# Patient Record
Sex: Male | Born: 1995 | Race: Black or African American | Hispanic: No | Marital: Single | Smoking: Never smoker
Health system: Southern US, Community
[De-identification: ages and names within clinical notes are randomized; demographics above are authoritative.]

---
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
## 2018-04-22 IMAGING — DX DG FACIAL BONES COMPLETE 3+V
4 series · 4 of 4 positions shown · non-contrast
Comparison: None available

CLINICAL DATA: Left eye injury playing basketball, left eye pain
and tenderness

EXAM:
FACIAL BONES COMPLETE 3+V

[facial pa townes]
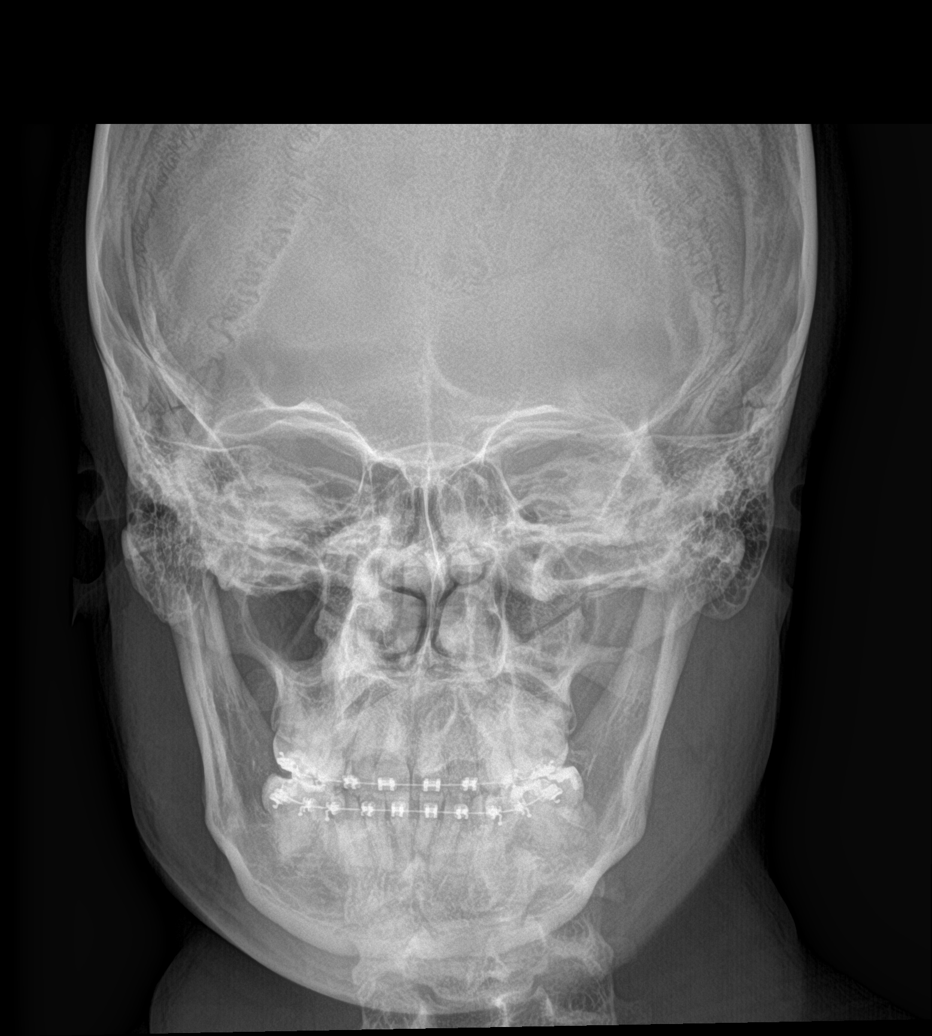

[facial waters]
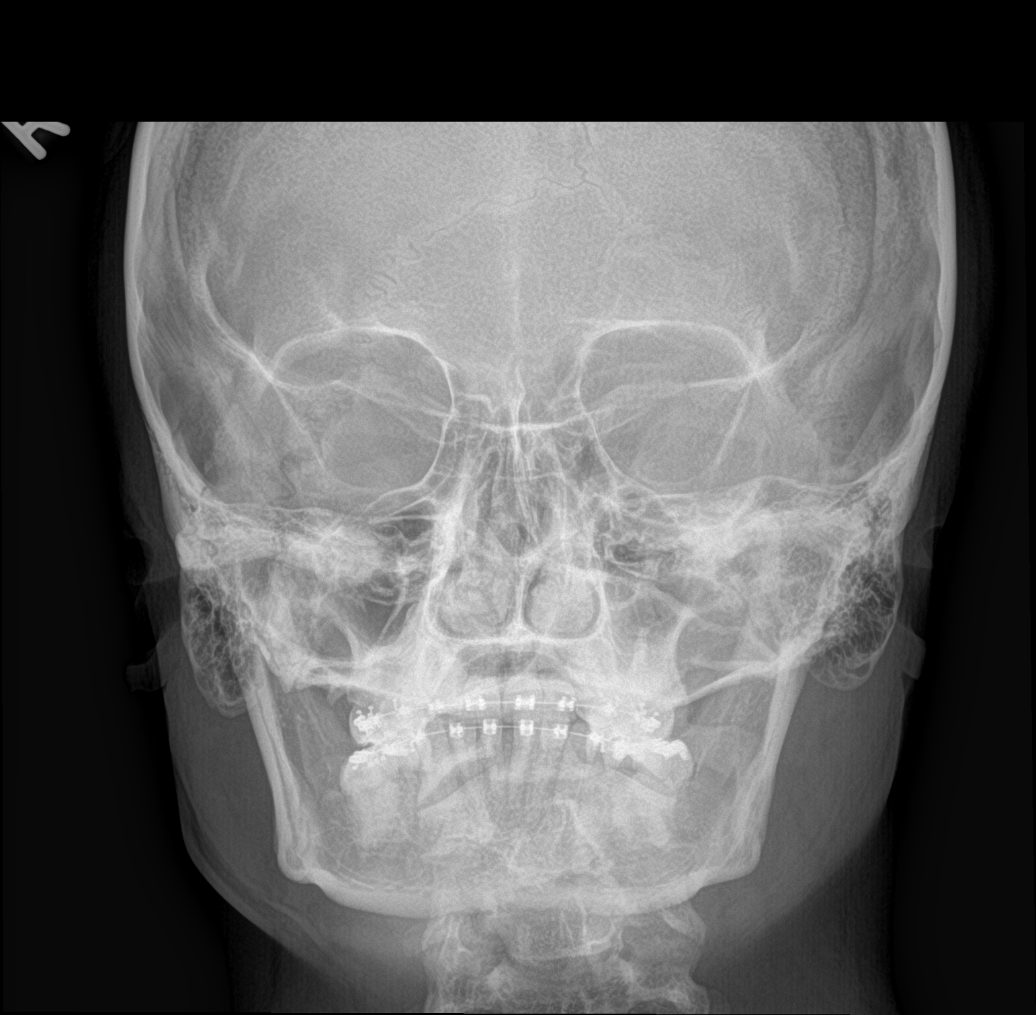

[facial lateral]
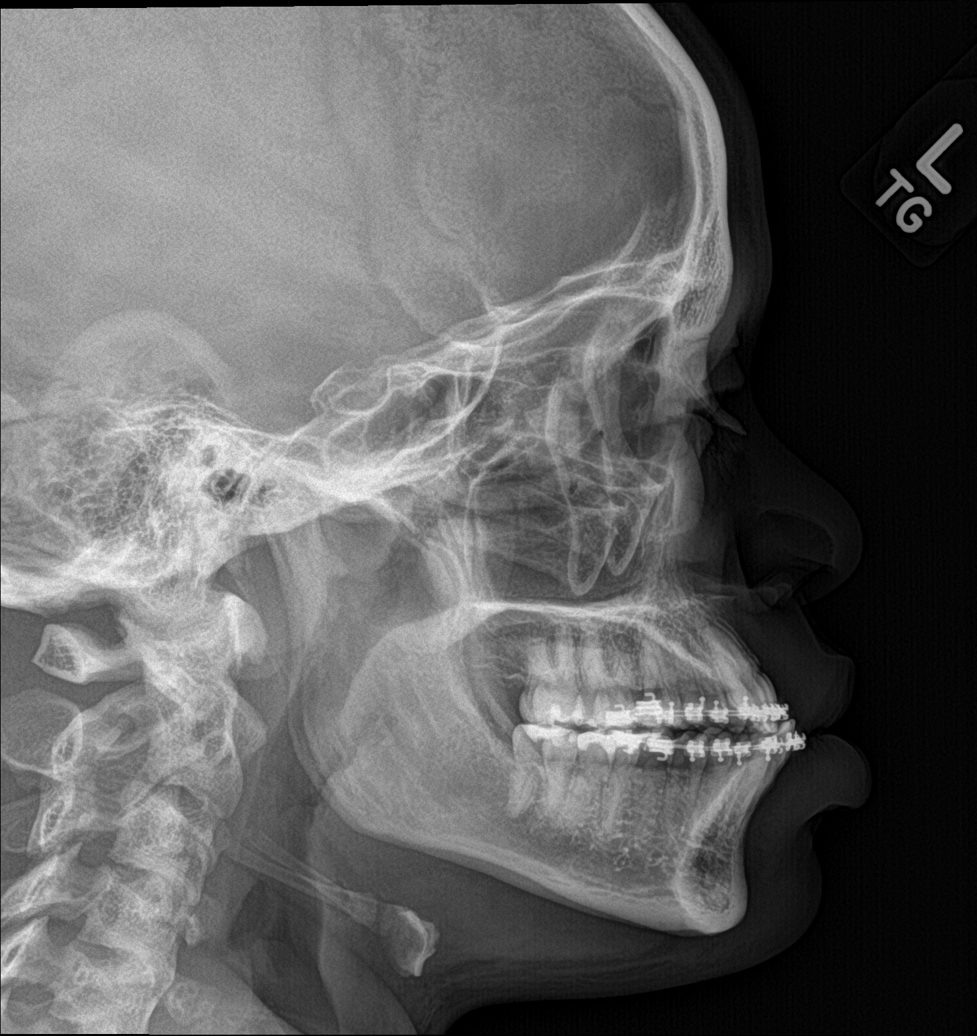

[facial smv]
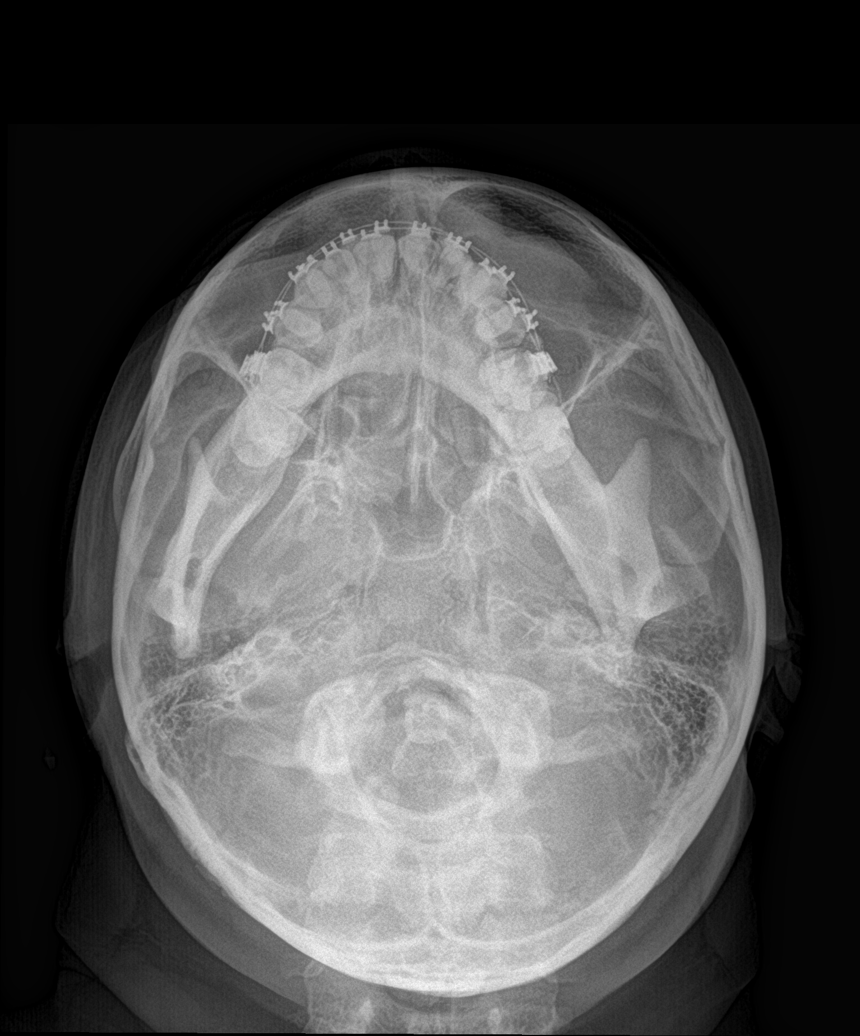

[4 of 4 positions shown; findings below may reference images not displayed]

FINDINGS: There is no evidence of fracture or other significant bone
abnormality. No orbital emphysema or sinus air-fluid levels are
seen.
IMPRESSION: No significant acute finding by plain radiography.
